# Patient Record
Sex: Female | Born: 1965 | Race: White | Hispanic: No | Smoking: Current every day smoker
Health system: Southern US, Community
[De-identification: ages and names within clinical notes are randomized; demographics above are authoritative.]

## PROBLEM LIST (undated history)

## (undated) DIAGNOSIS — J439 Emphysema, unspecified: Secondary | ICD-10-CM

## (undated) DIAGNOSIS — Z8619 Personal history of other infectious and parasitic diseases: Secondary | ICD-10-CM

## (undated) DIAGNOSIS — M549 Dorsalgia, unspecified: Secondary | ICD-10-CM

## (undated) DIAGNOSIS — G8929 Other chronic pain: Secondary | ICD-10-CM

## (undated) DIAGNOSIS — F329 Major depressive disorder, single episode, unspecified: Secondary | ICD-10-CM

## (undated) DIAGNOSIS — M5137 Other intervertebral disc degeneration, lumbosacral region: Secondary | ICD-10-CM

## (undated) DIAGNOSIS — G43909 Migraine, unspecified, not intractable, without status migrainosus: Secondary | ICD-10-CM

## (undated) DIAGNOSIS — T7840XA Allergy, unspecified, initial encounter: Secondary | ICD-10-CM

## (undated) DIAGNOSIS — R519 Headache, unspecified: Secondary | ICD-10-CM

## (undated) DIAGNOSIS — H8109 Meniere's disease, unspecified ear: Secondary | ICD-10-CM

## (undated) DIAGNOSIS — F32A Depression, unspecified: Secondary | ICD-10-CM

## (undated) DIAGNOSIS — R51 Headache: Secondary | ICD-10-CM

## (undated) DIAGNOSIS — M069 Rheumatoid arthritis, unspecified: Secondary | ICD-10-CM

## (undated) DIAGNOSIS — F1910 Other psychoactive substance abuse, uncomplicated: Secondary | ICD-10-CM

## (undated) HISTORY — DX: Headache: R51

## (undated) HISTORY — DX: Rheumatoid arthritis, unspecified: M06.9

## (undated) HISTORY — PX: KNEE ARTHROSCOPY W/ ACL RECONSTRUCTION: SHX1858

## (undated) HISTORY — DX: Migraine, unspecified, not intractable, without status migrainosus: G43.909

## (undated) HISTORY — DX: Other chronic pain: G89.29

## (undated) HISTORY — PX: TONSILLECTOMY: SUR1361

## (undated) HISTORY — DX: Emphysema, unspecified: J43.9

## (undated) HISTORY — PX: HAND SURGERY: SHX662

## (undated) HISTORY — PX: TYMPANOSTOMY TUBE PLACEMENT: SHX32

## (undated) HISTORY — DX: Allergy, unspecified, initial encounter: T78.40XA

## (undated) HISTORY — DX: Other intervertebral disc degeneration, lumbosacral region: M51.37

## (undated) HISTORY — DX: Personal history of other infectious and parasitic diseases: Z86.19

## (undated) HISTORY — DX: Dorsalgia, unspecified: M54.9

## (undated) HISTORY — PX: WRIST RECONSTRUCTION: SHX2675

## (undated) HISTORY — DX: Major depressive disorder, single episode, unspecified: F32.9

## (undated) HISTORY — DX: Other psychoactive substance abuse, uncomplicated: F19.10

## (undated) HISTORY — DX: Meniere's disease, unspecified ear: H81.09

## (undated) HISTORY — DX: Depression, unspecified: F32.A

## (undated) HISTORY — DX: Headache, unspecified: R51.9

---

## 2006-12-25 ENCOUNTER — Encounter: Admission: RE | Admit: 2006-12-25 | Discharge: 2006-12-25 | Payer: Self-pay | Admitting: Orthopedic Surgery

## 2010-12-25 DIAGNOSIS — H8109 Meniere's disease, unspecified ear: Secondary | ICD-10-CM

## 2010-12-25 DIAGNOSIS — M5137 Other intervertebral disc degeneration, lumbosacral region: Secondary | ICD-10-CM

## 2010-12-25 HISTORY — DX: Meniere's disease, unspecified ear: H81.09

## 2010-12-25 HISTORY — DX: Other intervertebral disc degeneration, lumbosacral region: M51.37

## 2012-06-15 DIAGNOSIS — F1910 Other psychoactive substance abuse, uncomplicated: Secondary | ICD-10-CM

## 2012-06-15 HISTORY — DX: Other psychoactive substance abuse, uncomplicated: F19.10

## 2012-06-28 DIAGNOSIS — M069 Rheumatoid arthritis, unspecified: Secondary | ICD-10-CM

## 2012-06-28 HISTORY — DX: Rheumatoid arthritis, unspecified: M06.9

## 2012-06-30 ENCOUNTER — Ambulatory Visit (INDEPENDENT_AMBULATORY_CARE_PROVIDER_SITE_OTHER): Payer: Self-pay | Admitting: Family Medicine

## 2012-06-30 ENCOUNTER — Encounter: Payer: Self-pay | Admitting: Family Medicine

## 2012-06-30 VITALS — BP 128/73 | HR 76 | Ht 65.0 in | Wt 140.0 lb

## 2012-06-30 DIAGNOSIS — M069 Rheumatoid arthritis, unspecified: Secondary | ICD-10-CM

## 2012-06-30 DIAGNOSIS — F1910 Other psychoactive substance abuse, uncomplicated: Secondary | ICD-10-CM

## 2012-06-30 DIAGNOSIS — M5137 Other intervertebral disc degeneration, lumbosacral region: Secondary | ICD-10-CM

## 2012-06-30 DIAGNOSIS — H8109 Meniere's disease, unspecified ear: Secondary | ICD-10-CM

## 2012-06-30 NOTE — Progress Notes (Signed)
CC: Julie [name] is a 47 y.o. female is here for Establish Care   Subjective: HPI:  Patient's past medical history was reviewed prior to visit using care everywhere showing recent visit to establish with a [facility] yesterday and visits in the past with my former practice [facility].  I have a concern regarding her controlled substance regimen do to a encounter note at [facility] 06/08/2012   when she was requesting oxycodone refill prior to appropriate time for refill and while under a narcotic contract with [facility] which I also worked under during my [facility] residency. Her past medical and behavioral history was discussed with a colleague of mine at [facility] to confirm that a narcotic contract was broken and that this patient is well-known at their practice for inappropriate early referral requests for Valium and oxycodone with multiple excuses over the past year.  It appears pain management referrals have been placed recently by [facility] and [facility] and [facility]  but patient has never established with a pain management clinic.  Patient's chief complaint today is an electric-like shooting pain down the right leg which is severe in nature and severe bilateral wrist pain. She describes this is been present for years and has been controlled with former oxycodone regimen.  She's been out of this regimen for a few weeks now and pain is now unbearable.  Nothing else seems to make better or worse. It is present on a daily basis.  I asked here her side of the story of her narcotic contract violation from [place].  She feels that this is one big misunderstanding as she reports calling the answering service during a snow day for refill and was instructed to followup in the resident clinic at [facility] by the physician on call. She reports when she did this she was then told that she violated her narcotic contract with  [facility]. I reviewed with her in person that she was not due for refill until the seventh of that month which is why some suspicion may have been aroused. She tells me is not uncommon for her to take extra oxycodone when she occasionally falls due to her Mnire's disease. She explicitly tells me that she doesn't understand why it such a big deal if she has to take a couple extra oxycodone without the permission of her prescribing physician.   Review Of Systems of Outlined In HPI  Past Medical History  Diagnosis Date  . Meniere disease   . Mixed, or nondependent drug abuse 06/15/2012    Overview:  Dismissed from [facility] 06/15/12 for seeking meds early at [facility] with pain contract in place Do not give controlled substances to this patient   . Degeneration of lumbar or lumbosacral intervertebral disc 12/25/2010  . Meniere's disease 12/25/2010  . Rheumatoid arthritis 06/28/2012     Family History  Problem Relation Age of Onset  . Cancer      parent  . Heart attack      parent  . Diabetes      grandparent,aunt  . Hypertension      parent  . Stroke      grandparent     History  Substance Use Topics  . Smoking status: Current Every Day Smoker -- 1.50 packs/day for 20 years    Types: Cigarettes  . Smokeless tobacco: Not on file  . Alcohol Use: No     Objective: Filed Vitals:   06/30/12  1509  BP: 128/73  Pulse: 76    Vital signs reviewed. General: Alert and Oriented, No Acute Distress HEENT: Pupils equal, round, reactive to light. Conjunctivae clear.  External ears unremarkable.  Moist mucous membranes. Lungs: Clear and comfortable work of breathing, speaking in full sentences without accessory muscle use. Cardiac: Regular rate and rhythm.  Neuro: CN II-XII grossly intact, gait normal. Extremities: No peripheral edema.  Strong peripheral pulses.  Mental Status: No depression, anxiety, nor agitation. Logical though process. Skin: Warm and dry.  Assessment & Plan: Darin  was seen today for establish care.  Diagnoses and associated orders for this visit:  Mixed, or nondependent drug abuse  Degeneration of lumbar or lumbosacral intervertebral disc  Meniere's disease, unspecified laterality  Rheumatoid arthritis  Discussed with the patient that I do not feel our philosophies on chronic narcotic use overlap much especially when taken other than prescribed.  Discussed with her that I did not feel comfortable prescribing controlled substances based on what I can gather from her past medical history. Similar to other physicians I have offered her pain management referral or rheumatology referral. She declines both. We were never multiple regimens of nonnarcotic pain medication all of which she declined.  She left dissatisfied with the above news before a more thorough physical exam could be performed. She was not interested in routine preventative services such as lab work and cancer risk.  Return if symptoms worsen or fail to improve.

## 2012-07-09 ENCOUNTER — Encounter: Payer: Self-pay | Admitting: Family Medicine

## 2012-07-09 DIAGNOSIS — Z8619 Personal history of other infectious and parasitic diseases: Secondary | ICD-10-CM

## 2012-07-09 DIAGNOSIS — E0590 Thyrotoxicosis, unspecified without thyrotoxic crisis or storm: Secondary | ICD-10-CM | POA: Insufficient documentation

## 2014-08-31 ENCOUNTER — Ambulatory Visit (INDEPENDENT_AMBULATORY_CARE_PROVIDER_SITE_OTHER): Payer: Medicare Other | Admitting: Nurse Practitioner

## 2014-08-31 ENCOUNTER — Encounter: Payer: Self-pay | Admitting: Nurse Practitioner

## 2014-08-31 ENCOUNTER — Telehealth: Payer: Self-pay | Admitting: *Deleted

## 2014-08-31 VITALS — BP 130/90 | HR 79 | Temp 98.0°F | Resp 18 | Ht 64.5 in | Wt 144.0 lb

## 2014-08-31 DIAGNOSIS — G8929 Other chronic pain: Secondary | ICD-10-CM

## 2014-08-31 DIAGNOSIS — M2550 Pain in unspecified joint: Secondary | ICD-10-CM | POA: Diagnosis not present

## 2014-08-31 DIAGNOSIS — M549 Dorsalgia, unspecified: Secondary | ICD-10-CM

## 2014-08-31 DIAGNOSIS — F329 Major depressive disorder, single episode, unspecified: Secondary | ICD-10-CM

## 2014-08-31 DIAGNOSIS — E039 Hypothyroidism, unspecified: Secondary | ICD-10-CM | POA: Diagnosis not present

## 2014-08-31 DIAGNOSIS — R4589 Other symptoms and signs involving emotional state: Secondary | ICD-10-CM

## 2014-08-31 LAB — CBC WITH DIFFERENTIAL/PLATELET
BASOS ABS: 0.1 10*3/uL (ref 0.0–0.1)
BASOS PCT: 0.6 % (ref 0.0–3.0)
EOS PCT: 2.5 % (ref 0.0–5.0)
Eosinophils Absolute: 0.2 10*3/uL (ref 0.0–0.7)
HEMATOCRIT: 57.7 % — AB (ref 36.0–46.0)
HEMOGLOBIN: 19 g/dL — AB (ref 12.0–15.0)
LYMPHS PCT: 23.1 % (ref 12.0–46.0)
Lymphs Abs: 2.3 10*3/uL (ref 0.7–4.0)
MCHC: 32.9 g/dL (ref 30.0–36.0)
MCV: 91.1 fl (ref 78.0–100.0)
MONOS PCT: 3.3 % (ref 3.0–12.0)
Monocytes Absolute: 0.3 10*3/uL (ref 0.1–1.0)
NEUTROS ABS: 6.9 10*3/uL (ref 1.4–7.7)
Neutrophils Relative %: 70.5 % (ref 43.0–77.0)
Platelets: 167 10*3/uL (ref 150.0–400.0)
RBC: 6.33 Mil/uL — AB (ref 3.87–5.11)
RDW: 14.7 % (ref 11.5–15.5)
WBC: 9.8 10*3/uL (ref 4.0–10.5)

## 2014-08-31 LAB — COMPREHENSIVE METABOLIC PANEL
ALBUMIN: 4.4 g/dL (ref 3.5–5.2)
ALT: 15 U/L (ref 0–35)
AST: 15 U/L (ref 0–37)
Alkaline Phosphatase: 75 U/L (ref 39–117)
BUN: 7 mg/dL (ref 6–23)
CHLORIDE: 101 meq/L (ref 96–112)
CO2: 30 mEq/L (ref 19–32)
Calcium: 10.2 mg/dL (ref 8.4–10.5)
Creatinine, Ser: 0.84 mg/dL (ref 0.40–1.20)
GFR: 77.34 mL/min (ref >60.00)
Glucose, Bld: 79 mg/dL (ref 70–99)
POTASSIUM: 3.9 meq/L (ref 3.5–5.1)
SODIUM: 140 meq/L (ref 135–145)
TOTAL PROTEIN: 7.2 g/dL (ref 6.0–8.3)
Total Bilirubin: 0.6 mg/dL (ref 0.2–1.2)

## 2014-08-31 LAB — URINALYSIS, MICROSCOPIC ONLY

## 2014-08-31 LAB — T4, FREE: Free T4: 0.63 ng/dL (ref 0.60–1.60)

## 2014-08-31 LAB — RHEUMATOID FACTOR

## 2014-08-31 LAB — SEDIMENTATION RATE: Sed Rate: 2 mm/hr (ref 0–22)

## 2014-08-31 LAB — TSH: TSH: 1.29 u[IU]/mL (ref 0.35–4.50)

## 2014-08-31 MED ORDER — OXYCODONE-ACETAMINOPHEN 10-325 MG PO TABS: 1.0000 | ORAL_TABLET | ORAL | Status: DC | PRN

## 2014-08-31 MED ORDER — PREDNISONE 10 MG PO TABS
ORAL_TABLET | ORAL | Status: DC
Start: 2014-08-31 — End: 2014-09-18

## 2014-08-31 MED ORDER — SERTRALINE HCL 25 MG PO TABS: ORAL_TABLET | ORAL | Status: DC

## 2014-08-31 NOTE — Telephone Encounter (Signed)
Noted. She is heavy smoker & probably a bit dehydrated.

## 2014-08-31 NOTE — Progress Notes (Signed)
Subjective:     Julie [name] is a 49 y.o. female presents to establish care. She says she needs her "meds". She has been taking oxycodone 20 mg T for many mos & 30 mg tablets before that for years. She ran out 6/19. She is feeling dizzy, having diarrhea, cannot sleep, back hurts & radiates into R buttock & leg to knee. Last provider she saw was Dr Hilma Favors in [facility] (07/27/14 note reviewed in care everywhere). Pt says he discharged her for "not agreeing with treatment plan". She says this came as surprise to her-she received letter. She understood he would refer her to pain management & psychiatry. She did not recv call from pain mngt & psychiatry location was not accepting new pts.  She has long Hx of depression-states "one death after another has occurred in her family". She has been taking prozac & well butrin for 5 yrs-doesn't think they work anymore. Doesn't take them daily. Back pain has been chronic for at least 10 yrs. She has been taking narcotic pain meds at least that long. Last pain management appt was May/2015 [facility]. She says she was discharged from that facility due to tested pos for hydrocodone, but prescribed oxycodone. She denies having taken anything but what was prescribed. She has been to PT, had "steroid injections" in back w/minimal relief. She has been taking valium since ran out of percocet. Minimal relief. She complains of multiple joint pain: hands, elbows, shoulders, hips. Had referral to rheumatology once, but was not able to keep appt. Denies swollen red hot joints, fever, sores in mouth, rash. Feels fatigued most of the time.  She is on disability. She is a smoker. "quit drinking" ETOH 2 yrs ago. Admits to 6 ppd beer for "years". She appears very uncomfortable-sighs a lot, moans, shifts positions, moves gingerly, talks frankly. Reviewed last yr [place] Controlled Substance Report : congruent w/patient report. Taking 1T oxycodone 20 mg qid for all 12  mos.  The following portions of the patient's history were reviewed and updated as appropriate: allergies, current medications, past family history, past medical history, past social history, past surgical history and problem list.  Review of Systems Eyes: positive for redness and L eye, denies injury Ears, nose, mouth, throat, and face: positive for hearing loss and has hearing aid-not wearing today Respiratory: positive for SOB w/little exertion-says prednisone makes her breath better Cardiovascular: negative for chest pain, chest pressure/discomfort, lower extremity edema and palpitations Gastrointestinal: positive for diarrhea Neurological: negative for paresthesia and weakness Behavioral/Psych: positive for depression, sleep disturbance and tobacco use    Objective:    BP 130/90 mmHg  Pulse 79  Temp(Src) 98 F (36.7 C) (Oral)  Resp 18  Ht 5' 4.5" (1.638 m)  Wt 144 lb (65.318 kg)  BMI 24.34 kg/m2  SpO2 95% BP 130/90 mmHg  Pulse 79  Temp(Src) 98 F (36.7 C) (Oral)  Resp 18  Ht 5' 4.5" (1.638 m)  Wt 144 lb (65.318 kg)  BMI 24.34 kg/m2  SpO2 95% General appearance: alert, cooperative, appears older than stated age, fatigued and moderate distress Head: Normocephalic, without obvious abnormality, atraumatic Eyes: negative findings: lids and lashes normal, positive findings: conjunctiva: trace injection, red raised area lateral L eye-pingueculum? Back: no tenderness to percussion or palpation, range of motion normal, traps tense, spinal accessory muscles tense. tender at bilat SIJ, no spinal tenderness. L shoulder higher than right. mild kyphosis Lungs: course breath sounds bilat Heart: regular rate and rhythm, S1, S2 normal, no  murmur, click, rub or gallop Neurologic: Gait: Antalgic MSK: pos squeeze test R foot & both hands. bilat 1st MCP enlarged & tender      Assessment:Plan   1. Polyarthralgia DD: RA, SLE, psoriatic arthritis, OA - CBC with Differential/Platelet -  Antinuclear Antibodies, IFA - Comprehensive metabolic panel - Cyclic citrul peptide antibody, IgG - TSH - T4, free - Sedimentation rate - Rheumatoid factor - Urine Microscopic - oxyCODONE-acetaminophen (PERCOCET) 10-325 MG per tablet; Take 1 tablet by mouth every 4 (four) hours as needed for pain.  Dispense: 30 tablet; Refill: 0 - predniSONE (DELTASONE) 10 MG tablet; Take 3Tpo qam X 3d, then 2T po qam X 4d, then 1Tpo qd X 4d  Dispense: 21 tablet; Refill: 0 - Ambulatory referral to Pain Clinic  2. Depressed affect start - sertraline (ZOLOFT) 25 MG tablet; Take 1 t po qhs X 1 wk, then 2T po qhs  Dispense: 30 tablet; Refill: 0 Stop prozac Cont wellbutrin - Ambulatory referral to Psychiatry  3. Chronic back pain DD: DDD, sacroilliitis  - Ambulatory referral to Pain Clinic  F/u 2 weeks-pain, labs

## 2014-08-31 NOTE — Progress Notes (Signed)
Pre visit review using our clinic review tool, if applicable. No additional management support is needed unless otherwise documented below in the visit note. 

## 2014-08-31 NOTE — Patient Instructions (Signed)
Please start prednisone. Take in morning as it will keep you awake. Start sertraline. Take at night. Stop prozac. Continue wellbutrin. Take oxycodone & valium as needed. Please see psychiatry & pain clinic.  Please return in 10 days.  Nice to meet you.

## 2014-08-31 NOTE — Telephone Encounter (Signed)
Clydie Braun from [place] lab called with critical lab results for pt. Hgb of 19. Please advise. Thanks.

## 2014-09-01 LAB — ANTINUCLEAR ANTIBODIES, IFA: ANA Titer 1: NEGATIVE

## 2014-09-01 LAB — CYCLIC CITRUL PEPTIDE ANTIBODY, IGG: Cyclic Citrullin Peptide Ab: <2 U/mL (ref 0.0–5.0)

## 2014-09-04 ENCOUNTER — Telehealth: Payer: Self-pay | Admitting: Nurse Practitioner

## 2014-09-04 NOTE — Telephone Encounter (Signed)
LMOM for pt to CB.  VM was set up by a man, not pt.

## 2014-09-04 NOTE — Telephone Encounter (Signed)
pls call pt: Advise Labs normal except hemoglobin is high-she sould return for additional labs to screen for hemochromatosis-too much iron. pls shedule f/u appt.

## 2014-09-05 NOTE — Telephone Encounter (Signed)
LMOM for pt to CB.  

## 2014-09-06 NOTE — Telephone Encounter (Signed)
Pt aware.  Appointment scheduled 09/13/14.

## 2014-09-07 ENCOUNTER — Ambulatory Visit: Payer: Medicare Other | Admitting: Nurse Practitioner

## 2014-09-13 ENCOUNTER — Ambulatory Visit: Payer: Medicare Other | Admitting: Nurse Practitioner

## 2014-09-18 ENCOUNTER — Ambulatory Visit (INDEPENDENT_AMBULATORY_CARE_PROVIDER_SITE_OTHER): Payer: Medicare Other | Admitting: Nurse Practitioner

## 2014-09-18 ENCOUNTER — Encounter: Payer: Self-pay | Admitting: Nurse Practitioner

## 2014-09-18 VITALS — BP 127/85 | HR 82 | Temp 97.8°F | Resp 16 | Ht 65.0 in | Wt 147.4 lb

## 2014-09-18 DIAGNOSIS — D582 Other hemoglobinopathies: Secondary | ICD-10-CM

## 2014-09-18 DIAGNOSIS — F329 Major depressive disorder, single episode, unspecified: Secondary | ICD-10-CM | POA: Diagnosis not present

## 2014-09-18 DIAGNOSIS — R4589 Other symptoms and signs involving emotional state: Secondary | ICD-10-CM

## 2014-09-18 DIAGNOSIS — M2550 Pain in unspecified joint: Secondary | ICD-10-CM

## 2014-09-18 LAB — IRON AND TIBC
%SAT: 16 % — ABNORMAL LOW (ref 20–55)
Iron: 48 ug/dL (ref 42–145)
TIBC: 294 ug/dL (ref 250–470)
UIBC: 246 ug/dL (ref 125–400)

## 2014-09-18 LAB — TRANSFERRIN: TRANSFERRIN: 248 mg/dL (ref 212.0–360.0)

## 2014-09-18 MED ORDER — OXYCODONE-ACETAMINOPHEN 10-325 MG PO TABS: 1.0000 | ORAL_TABLET | Freq: Two times a day (BID) | ORAL | Status: DC | PRN

## 2014-09-18 MED ORDER — SERTRALINE HCL 50 MG PO TABS: 50.0000 mg | ORAL_TABLET | Freq: Every day | ORAL | Status: AC

## 2014-09-18 NOTE — Patient Instructions (Signed)
My office will call with lab results.  Consider using Neiled sinus wash daily for best allergy management & sinus health.   Please keep appointment with pain management and make appointment with psychiatry.

## 2014-09-19 ENCOUNTER — Encounter: Payer: Self-pay | Admitting: Nurse Practitioner

## 2014-09-19 ENCOUNTER — Telehealth: Payer: Self-pay | Admitting: Nurse Practitioner

## 2014-09-19 DIAGNOSIS — D582 Other hemoglobinopathies: Secondary | ICD-10-CM | POA: Insufficient documentation

## 2014-09-19 DIAGNOSIS — R4589 Other symptoms and signs involving emotional state: Secondary | ICD-10-CM | POA: Insufficient documentation

## 2014-09-19 DIAGNOSIS — M2550 Pain in unspecified joint: Secondary | ICD-10-CM | POA: Insufficient documentation

## 2014-09-19 NOTE — Progress Notes (Signed)
Subjective:     Julie [name] is a 49 y.o. female presents for f/u of elevated hemoglobin. She is a smoker, but has been seeking treatment for chronic MSK pain for years. She denies palpitations, fatigue, abdominal pain. I will assess iron panel & transferritin to screen for hemochromatosis.  She has appointment with pain management clinic in 4 weeks. Her rheum w/u was negative-at least no serum markers. I did not order xrays of hands & feet. She tells me the prednisone I presribed did not help her back or joint pain. She did, however, run use all 30 T of oxycodone within 5 days of last OV. Since then, she has been taking NSAIDS without much relief.  Last OV I stopped paxil & started zoloft. Ms Loudenslager reports she has had some elevation of mood. No intol SE. She continues to take wellbutrin. I ref her to psychiatry. She does not have appt yet.  The following portions of the patient's history were reviewed and updated as appropriate: allergies, current medications, past medical history, past social history, past surgical history and problem list.  Review of Systems Pertinent items are noted in HPI.    Objective:    BP 127/85 mmHg  Pulse 82  Temp(Src) 97.8 F (36.6 C) (Oral)  Resp 16  Ht 5\' 5"  (1.651 m)  Wt 147 lb 6.4 oz (66.86 kg)  BMI 24.53 kg/m2  SpO2 95% General appearance: alert, cooperative, appears older than stated age, mild distress and constantly rubbing R thigh, moans & sighs as ambulates around office Eyes: positive findings: conjunctiva: 2+ injection Neurologic: Gait: Antalgic    Assessment:Plan   1. Elevated hemoglobin Likely secondary to smoker, but will screen for hemochromatosis Stop smoking - Transferrin - Iron and TIBC  2. Polyarthralgia No serum markers for RA. xrays not performed.  Pt has appt w/pain management in 4 weeks. - oxyCODONE-acetaminophen (PERCOCET) 10-325 MG per tablet; Take 1 tablet by mouth 2 (two) times daily as needed for pain.  Dispense: 60  tablet; Refill: 0  3. Depressed affect continue - sertraline (ZOLOFT) 50 MG tablet; Take 1 tablet (50 mg total) by mouth daily.  Dispense: 30 tablet; Refill: 2 See psychiatry  F/u PRN labs

## 2014-09-19 NOTE — Telephone Encounter (Signed)
pls call pt: Julie [name] iron is not causing [name] to have too many red cells-it is due to smoking. Having too many red cells increases [name] risk for stroke, she should try to cut back or quit cigarettes.

## 2014-09-20 NOTE — Telephone Encounter (Signed)
LMOM for pt to CB.  

## 2014-09-25 NOTE — Telephone Encounter (Signed)
Patient aware.    Patient states that taking two of the percocet is not killing her pain.  Pt wants to know what to do next?  Please advise.

## 2014-09-26 NOTE — Telephone Encounter (Signed)
She needs to see pain management

## 2014-09-26 NOTE — Telephone Encounter (Signed)
Pts phone "can not accept calls at this time".   Will have to try again later.   Pt has appointment scheduled with pain management in August.

## 2014-09-28 NOTE — Telephone Encounter (Signed)
Patient aware she must wait to see pain management.

## 2014-09-28 NOTE — Telephone Encounter (Signed)
Patient called back. Please call her at [phone].

## 2014-11-06 ENCOUNTER — Telehealth: Payer: Self-pay

## 2014-11-06 NOTE — Telephone Encounter (Signed)
Pt has been rxed the following:   Percocet 10/325 1 tablet BID  Nucynta ER 100 mg 1 tablet BID

## 2017-04-26 ENCOUNTER — Emergency Department (HOSPITAL_BASED_OUTPATIENT_CLINIC_OR_DEPARTMENT_OTHER)
Admission: EM | Admit: 2017-04-26 | Discharge: 2017-04-26 | Disposition: A | Discharge status: Home / Self Care | Payer: Medicare Other | Attending: Emergency Medicine | Admitting: Emergency Medicine

## 2017-04-26 ENCOUNTER — Encounter (HOSPITAL_BASED_OUTPATIENT_CLINIC_OR_DEPARTMENT_OTHER): Payer: Self-pay | Admitting: *Deleted

## 2017-04-26 ENCOUNTER — Emergency Department (HOSPITAL_BASED_OUTPATIENT_CLINIC_OR_DEPARTMENT_OTHER): Payer: Medicare Other

## 2017-04-26 ENCOUNTER — Other Ambulatory Visit: Payer: Self-pay

## 2017-04-26 DIAGNOSIS — M069 Rheumatoid arthritis, unspecified: Secondary | ICD-10-CM | POA: Diagnosis not present

## 2017-04-26 DIAGNOSIS — Z79899 Other long term (current) drug therapy: Secondary | ICD-10-CM | POA: Insufficient documentation

## 2017-04-26 DIAGNOSIS — L0201 Cutaneous abscess of face: Secondary | ICD-10-CM | POA: Diagnosis not present

## 2017-04-26 DIAGNOSIS — J449 Chronic obstructive pulmonary disease, unspecified: Secondary | ICD-10-CM | POA: Insufficient documentation

## 2017-04-26 DIAGNOSIS — F17210 Nicotine dependence, cigarettes, uncomplicated: Secondary | ICD-10-CM | POA: Insufficient documentation

## 2017-04-26 LAB — COMPREHENSIVE METABOLIC PANEL
ALT: 19 U/L (ref 14–54)
AST: 15 U/L (ref 15–41)
Albumin: 3.8 g/dL (ref 3.5–5.0)
Alkaline Phosphatase: 67 U/L (ref 38–126)
Anion gap: 8 (ref 5–15)
BUN: 16 mg/dL (ref 6–20)
CHLORIDE: 103 mmol/L (ref 101–111)
CO2: 30 mmol/L (ref 22–32)
CREATININE: 0.7 mg/dL (ref 0.44–1.00)
Calcium: 9 mg/dL (ref 8.9–10.3)
Glucose, Bld: 99 mg/dL (ref 65–99)
POTASSIUM: 3.7 mmol/L (ref 3.5–5.1)
Sodium: 141 mmol/L (ref 135–145)
Total Bilirubin: 0.5 mg/dL (ref 0.3–1.2)
Total Protein: 6.5 g/dL (ref 6.5–8.1)

## 2017-04-26 LAB — CBC WITH DIFFERENTIAL/PLATELET
Basophils Absolute: 0 10*3/uL (ref 0.0–0.1)
Basophils Relative: 0 %
EOS ABS: 0.5 10*3/uL (ref 0.0–0.7)
Eosinophils Relative: 3 %
HCT: 41.5 % (ref 36.0–46.0)
Hemoglobin: 13.8 g/dL (ref 12.0–15.0)
LYMPHS ABS: 1.6 10*3/uL (ref 0.7–4.0)
Lymphocytes Relative: 9 %
MCH: 29.6 pg (ref 26.0–34.0)
MCHC: 33.3 g/dL (ref 30.0–36.0)
MCV: 88.9 fL (ref 78.0–100.0)
Monocytes Absolute: 1.1 10*3/uL — ABNORMAL HIGH (ref 0.1–1.0)
Monocytes Relative: 6 %
NEUTROS PCT: 82 %
Neutro Abs: 14.4 10*3/uL — ABNORMAL HIGH (ref 1.7–7.7)
Platelets: 182 10*3/uL (ref 150–400)
RBC: 4.67 MIL/uL (ref 3.87–5.11)
RDW: 14 % (ref 11.5–15.5)
WBC: 17.7 10*3/uL — ABNORMAL HIGH (ref 4.0–10.5)

## 2017-04-26 LAB — HCG, QUANTITATIVE, PREGNANCY: hCG, Beta Chain, Quant, S: 1 m[IU]/mL (ref <5)

## 2017-04-26 MED ORDER — CEPHALEXIN 500 MG PO CAPS: 500.0000 mg | ORAL_CAPSULE | Freq: Two times a day (BID) | ORAL | 0 refills | Status: AC

## 2017-04-26 MED ORDER — SULFAMETHOXAZOLE-TRIMETHOPRIM 800-160 MG PO TABS
1.0000 | ORAL_TABLET | Freq: Once | ORAL | Status: AC
  Administered 2017-04-26: 1 via ORAL
  Filled 2017-04-26: qty 1

## 2017-04-26 MED ORDER — CEPHALEXIN 250 MG PO CAPS
500.0000 mg | ORAL_CAPSULE | Freq: Once | ORAL | Status: AC
  Administered 2017-04-26: 500 mg via ORAL
  Filled 2017-04-26: qty 2

## 2017-04-26 MED ORDER — LIDOCAINE-EPINEPHRINE 2 %-1:100000 IJ SOLN
20.0000 mL | Freq: Once | INTRAMUSCULAR | Status: DC
  Filled 2017-04-26: qty 1

## 2017-04-26 MED ORDER — MORPHINE SULFATE (PF) 4 MG/ML IV SOLN
4.0000 mg | Freq: Once | INTRAVENOUS | Status: AC
  Administered 2017-04-26: 4 mg via INTRAVENOUS
  Filled 2017-04-26: qty 1

## 2017-04-26 MED ORDER — IOPAMIDOL (ISOVUE-300) INJECTION 61%
100.0000 mL | Freq: Once | INTRAVENOUS | Status: AC | PRN
  Administered 2017-04-26: 75 mL via INTRAVENOUS

## 2017-04-26 MED ORDER — ONDANSETRON HCL 4 MG/2ML IJ SOLN
4.0000 mg | Freq: Once | INTRAMUSCULAR | Status: AC
  Administered 2017-04-26: 4 mg via INTRAVENOUS
  Filled 2017-04-26: qty 2

## 2017-04-26 MED ORDER — SULFAMETHOXAZOLE-TRIMETHOPRIM 800-160 MG PO TABS: 1.0000 | ORAL_TABLET | Freq: Two times a day (BID) | ORAL | 0 refills | Status: AC

## 2017-04-26 MED ORDER — OXYCODONE-ACETAMINOPHEN 5-325 MG PO TABS
1.0000 | ORAL_TABLET | Freq: Once | ORAL | Status: AC
  Administered 2017-04-26: 1 via ORAL
  Filled 2017-04-26: qty 1

## 2017-04-26 MED ORDER — CLINDAMYCIN PHOSPHATE 600 MG/50ML IV SOLN: 600.0000 mg | Freq: Once | INTRAVENOUS | Status: DC

## 2017-04-26 MED ORDER — OXYCODONE-ACETAMINOPHEN 5-325 MG PO TABS: 1.0000 | ORAL_TABLET | Freq: Four times a day (QID) | ORAL | 0 refills | Status: AC | PRN

## 2017-04-26 NOTE — ED Notes (Signed)
ED Provider at bedside with US 

## 2017-04-26 NOTE — ED Notes (Signed)
Pt reminded of the need for a urine sample. Secretary advised pt ambulated to restroom a couple hours ago, but was not given a specimen cup.  Pt unable to void at this time.

## 2017-04-26 NOTE — ED Triage Notes (Signed)
Alert, NAD, calm, interactive, resps e/u, speaking in clear complete sentences, no dyspnea noted, skin W&D, VSS, c/o mid lower chin abscess with heat, swelling and TTP, actively draining (scant, serous), swelling up to lower lip, (denies: sob, NVD, fever, dizziness or visual changes), no meds PTA, no relief with advil or APAP yesterday. Tdap unknown. No flu vaccine this season. Family at [facility].

## 2017-04-26 NOTE — ED Notes (Signed)
Assumed care of patient from [place], [place]. Pt resting quietly. No distress. Airway patent. Awaiting EDP disposition.

## 2017-04-26 NOTE — ED Provider Notes (Signed)
[facility] EMERGENCY DEPARTMENT Provider Note   CSN: [id number] Arrival date & time: 04/26/17  0606     History   Chief Complaint Chief Complaint  Patient presents with  . Abscess    HPI Julie [name] is a 52 y.o. female.  52 year old female with past medical history including COPD, rheumatoid arthritis, Mnire's disease, migraines who presents with a chin infection.  Yesterday she began having pain and swelling of her chin around an area that initially felt like a pimple.  The swelling has spread to her lower lip and around her right jaw as well as below her chin.  She has had associated warmth, redness, and now severe and constant pain.  Pain not improved with Tylenol and advil at home yesterday.  She denies any problems swallowing, breathing difficulty, fevers, nausea, vomiting, diarrhea, or recent illness.  She denies any dental pain.   The history is provided by the patient.  Abscess    Past Medical History:  Diagnosis Date  . Allergy   . Chronic back pain   . Degeneration of lumbar or lumbosacral intervertebral disc 12/25/2010  . Depression   . Emphysema of lung (HCC)   . Frequent headaches   . History of chicken pox   . History of shingles   . Meniere disease   . Meniere's disease 12/25/2010  . Migraines   . Mixed, or nondependent drug abuse 06/15/2012   Overview:  Dismissed from [facility] 06/15/12 for seeking meds early at [facility] with pain contract in place Do not give controlled substances to this patient   . Rheumatoid arthritis(714.0) 06/28/2012    Patient Active Problem List   Diagnosis Date Noted  . Depressed affect 09/19/2014  . Polyarthralgia 09/19/2014  . Elevated hemoglobin (HCC) 09/19/2014  . History of shingles 07/09/2012  . Hyperthyroidism 07/09/2012  . Rheumatoid arthritis (HCC) 06/28/2012  . Mixed, or nondependent drug abuse 06/15/2012  . Degeneration of lumbar or lumbosacral intervertebral disc 12/25/2010  . Meniere's disease  12/25/2010    Past Surgical History:  Procedure Laterality Date  . CESAREAN SECTION  1992,2000  . HAND SURGERY  x 2  . KNEE ARTHROSCOPY W/ ACL RECONSTRUCTION    . TONSILLECTOMY    . TYMPANOSTOMY TUBE PLACEMENT    . WRIST RECONSTRUCTION      OB History    No data available       Home Medications    Prior to Admission medications   Medication Sig Start Date End Date Taking? Authorizing Provider  beclomethasone (QVAR) 40 MCG/ACT inhaler Inhale 2 puffs into the lungs. 09/26/13   [provider]  BuPROPion HCl (WELLBUTRIN PO) Take by mouth.    [provider]  cephALEXin (KEFLEX) 500 MG capsule Take 1 capsule (500 mg total) by mouth 2 (two) times daily. 04/26/17   Little, Ambrose Finland, MD  diazepam (VALIUM) 5 MG tablet  08/29/14   [provider]  dicyclomine (BENTYL) 20 MG tablet Take 20 mg by mouth 2 (two) times daily.    [provider]  Fluocinolone Acetonide 0.01 % OIL  06/22/14   [provider]  fluticasone Aleda Grana) 50 MCG/ACT nasal spray  06/20/14   [provider]  omeprazole (PRILOSEC) 40 MG capsule Take 40 mg by mouth daily.    [provider]  oxyCODONE-acetaminophen (PERCOCET) 5-325 MG tablet Take 1-2 tablets by mouth every 6 (six) hours as needed. 04/26/17   Little, Ambrose Finland, MD  PROAIR HFA 108 [phone] BASE) MCG/ACT inhaler  06/23/14   [provider]  rizatriptan (MAXALT) 10 MG tablet Take 10 mg by mouth as needed for migraine. May repeat in 2 hours if needed    [provider]  sertraline (ZOLOFT) 50 MG tablet Take 1 tablet (50 mg total) by mouth daily. 09/18/14   Kelle Darting, NP  sulfamethoxazole-trimethoprim (BACTRIM DS,SEPTRA DS) 800-160 MG tablet Take 1 tablet by mouth 2 (two) times daily for 7 days. 04/26/17 05/03/17  Little, Ambrose Finland, MD  triamterene-hydrochlorothiazide (DYAZIDE) 37.5-25 MG per capsule Take 1 capsule by mouth every morning.    [provider]    Family  History Family History  Problem Relation Age of Onset  . Goiter Mother   . Brain cancer Mother   . Hypertension Mother   . Heart attack Father   . Arthritis Father   . Meniere's disease Father   . Kidney cancer Sister   . Cancer Sister        renal  . Diabetes Maternal Aunt   . Stroke Maternal Grandmother   . Diabetes Maternal Grandmother     Social History Social History   Tobacco Use  . Smoking status: Current Every Day Smoker    Packs/day: 1.50    Years: 32.00    Pack years: 48.00    Types: Cigarettes  . Smokeless tobacco: Never Used  Substance Use Topics  . Alcohol use: No    Comment: pt reports quit 2 yrs ago-6 pk beer daily  . Drug use: No     Allergies   Penicillins   Review of Systems Review of Systems All other systems reviewed and are negative except that which was mentioned in HPI   Physical Exam Updated Vital Signs BP (!) 111/96 (BP Location: Right Arm)   Pulse 82   Temp 97.6 F (36.4 C) (Oral)   Resp 20   SpO2 96%   Physical Exam  Constitutional: She is oriented to person, place, and time. She appears well-developed and well-nourished. No distress.  uncomfortable  HENT:  Head: Normocephalic and atraumatic.  Mouth/Throat: Oropharynx is clear and moist.  Moist mucous membranes Swelling of chin spreading to upper neck, L mandible w/ associated erythema and tenderness, no obvious dental abscess although poor dentition, small draining pustule on central chin, edema of R lower lip, no tongue swelling  Eyes: Conjunctivae are normal.  Neck: Neck supple.  Cardiovascular: Normal rate, regular rhythm and normal heart sounds.  No murmur heard. Pulmonary/Chest: Effort normal and breath sounds normal.  Abdominal: Soft. Bowel sounds are normal. She exhibits no distension. There is no tenderness.  Musculoskeletal: She exhibits no edema.  Neurological: She is alert and oriented to person, place, and time.  Fluent speech  Skin: Skin is warm and dry.  There is erythema.  Psychiatric: She has a normal mood and affect. Judgment normal.  Nursing note and vitals reviewed.      ED Treatments / Results  Labs (all labs ordered are listed, but only abnormal results are displayed) Labs Reviewed  CBC WITH DIFFERENTIAL/PLATELET - Abnormal; Notable for the following components:      Result Value   WBC 17.7 (*)    Neutro Abs 14.4 (*)    Monocytes Absolute 1.1 (*)    All other components within normal limits  COMPREHENSIVE METABOLIC PANEL  HCG, QUANTITATIVE, PREGNANCY    EKG  EKG Interpretation None       Radiology Ct Soft Tissue Neck W Contrast  Result Date: 04/26/2017 CLINICAL DATA:  Neck  pain.  Suspect infection.  Swelling of chin EXAM: CT NECK WITH CONTRAST TECHNIQUE: Multidetector CT imaging of the neck was performed using the standard protocol following the bolus administration of intravenous contrast. CONTRAST:  60mL ISOVUE-300 IOPAMIDOL (ISOVUE-300) INJECTION 61% COMPARISON:  None. FINDINGS: Pharynx and larynx: Normal. No mass or swelling. Salivary glands: No inflammation, mass, or stone. Thyroid: Multiple subcentimeter left thyroid nodules. Right thyroid lobe normal. Lymph nodes: No pathologic adenopathy. Subcentimeter lymph nodes in the neck bilaterally appear reactive. Vascular: Negative Limited intracranial: Negative Visualized orbits: Negative Mastoids and visualized paranasal sinuses: Negative Skeleton: Poor dentition with numerous caries. No periapical dental infection. Cervical kyphosis with multilevel degenerative change. Upper chest: Emphysema without mass or infiltrate Other: Soft tissue swelling anterior to the mandible in the midline. Subcutaneous rim enhancing fluid collection measuring 8 x 12 mm compatible with dermal abscess with surrounding cellulitis. This does not appear to be of dental origin. IMPRESSION: 8 x 12 mm dermal abscess anterior to the mandible in the midline. Poor dentition with numerous caries Reactive  lymph nodes in the neck Multiple subcentimeter left thyroid nodules. Electronically Signed   By: Marlan Palau M.D.   On: 04/26/2017 11:46    Procedures .Marland KitchenIncision and Drainage Date/Time: 04/26/2017 3:25 PM Performed by: Laurence Spates, MD Authorized by: Laurence Spates, MD   Consent:    Consent obtained:  Verbal   Consent given by:  Patient   Risks discussed:  Bleeding, damage to other organs and pain (scarring) Location:    Type:  Abscess   Size:  8 x 12 mm   Location:  Head   Head location:  Face Pre-procedure details:    Skin preparation:  Betadine Anesthesia (see MAR for exact dosages):    Anesthesia method:  Local infiltration   Local anesthetic:  Lidocaine 2% WITH epi Procedure type:    Complexity:  Simple Procedure details:    Incision types:  Stab incision   Incision depth:  Dermal   Scalpel blade:  11   Drainage:  Bloody   Drainage amount:  Scant   Wound treatment:  Wound left open   Packing materials:  None Post-procedure details:    Patient tolerance of procedure:  Tolerated well, no immediate complications   (including critical care time)  Medications Ordered in ED Medications  lidocaine-EPINEPHrine (XYLOCAINE W/EPI) 2 %-1:100000 (with pres) injection 20 mL (not administered)  morphine 4 MG/ML injection 4 mg (4 mg Intravenous Given 04/26/17 0859)  ondansetron (ZOFRAN) injection 4 mg (4 mg Intravenous Given 04/26/17 0859)  iopamidol (ISOVUE-300) 61 % injection 100 mL (75 mLs Intravenous Contrast Given 04/26/17 1110)  oxyCODONE-acetaminophen (PERCOCET/ROXICET) 5-325 MG per tablet 1 tablet (1 tablet Oral Given 04/26/17 1335)  sulfamethoxazole-trimethoprim (BACTRIM DS,SEPTRA DS) 800-160 MG per tablet 1 tablet (1 tablet Oral Given 04/26/17 1335)  cephALEXin (KEFLEX) capsule 500 mg (500 mg Oral Given 04/26/17 1335)     Initial Impression / Assessment and Plan / ED Course  I have reviewed the triage vital signs and the nursing notes.  Pertinent labs &  imaging results that were available during my care of the patient were reviewed by me and considered in my medical decision making (see chart for details).     VS reassuring on arrival. Airway clear, tolerating secretions.  Swelling and redness extends to mandible and below chin, concern for deep space infection and differential does include Ludwig's angina. Obtained CT for better evaluation.  Labs show WBC 17.7.  CT shows 8 x 12 mm dermal  abscess midline anterior to the mandible, near the corresponding area on exam.  Area does not appear to be odontogenic.  I discussed risks and benefits of incision and drainage including risk of scarring.  Patient voiced understanding and was willing to proceed. Very little drainage from I&D. Discussed w/ ENT, Dr. Lazarus Salines, who agrees with plan to start antibiotics and warm compresses and have her see him in clinic tomorrow.  She voiced understanding of this plan as well as return precautions.  Final Clinical Impressions(s) / ED Diagnoses   Final diagnoses:  Abscess of chin    ED Discharge Orders        Ordered    cephALEXin (KEFLEX) 500 MG capsule  2 times daily     04/26/17 1412    sulfamethoxazole-trimethoprim (BACTRIM DS,SEPTRA DS) 800-160 MG tablet  2 times daily     04/26/17 1412    oxyCODONE-acetaminophen (PERCOCET) 5-325 MG tablet  Every 6 hours PRN     04/26/17 1412       Little, Ambrose Finland, MD 04/26/17 1526

## 2017-04-26 NOTE — ED Notes (Signed)
Pt requesting pain medication. Dr. Clarene Duke made aware

## 2017-04-26 NOTE — Discharge Instructions (Signed)
CALL DR. Ray[facility] S[facility] TOMORROW FOR AN APPOINTMENT.

## 2018-12-07 IMAGING — CT CT NECK W/ CM
4 of 6 series · 13 of 33 positions shown, 15 images · IV contrast (iopamidol)
Comparison: None.

CLINICAL DATA: Neck pain.  Suspect infection.  Swelling of chin

EXAM:
CT NECK WITH CONTRAST
TECHNIQUE: Multidetector CT imaging of the neck was performed using the
standard protocol following the bolus administration of intravenous
contrast.
CONTRAST:  75mL [id number] IOPAMIDOL ([id number]) INJECTION 61%

[Series 7: sag neck · sagittal · 0.56mm/px · 5 of 116 slices shown, 6 images]
[im 39/116  bone]
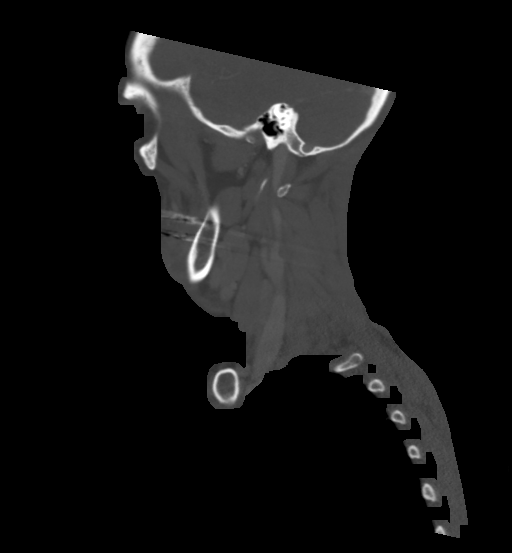
[im 48/116  bone]
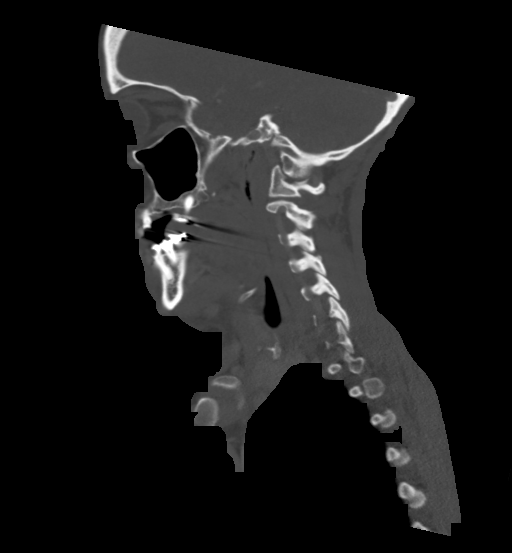
[im 58/116  soft-tissue]
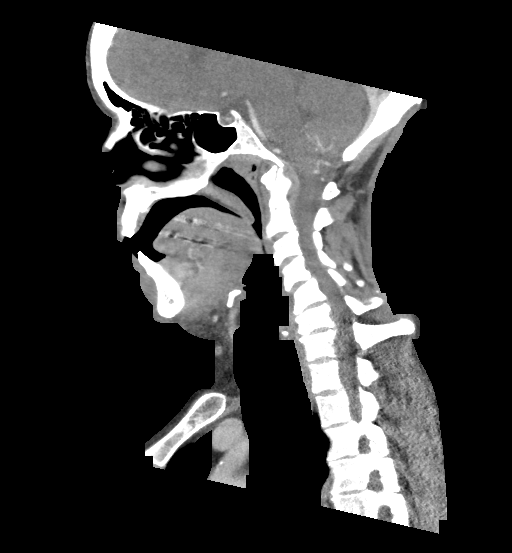
[im 58/116  bone]
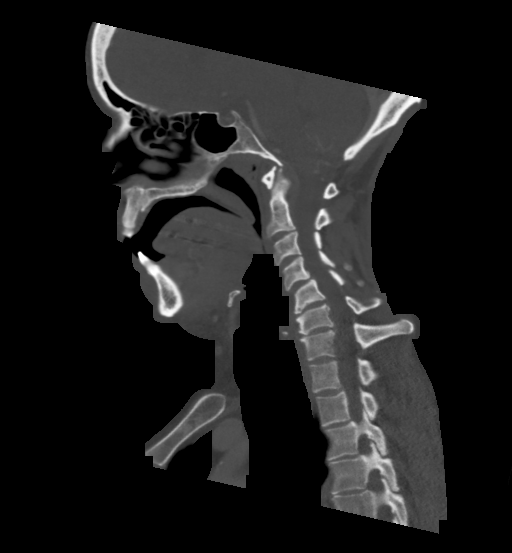
[im 68/116  bone]
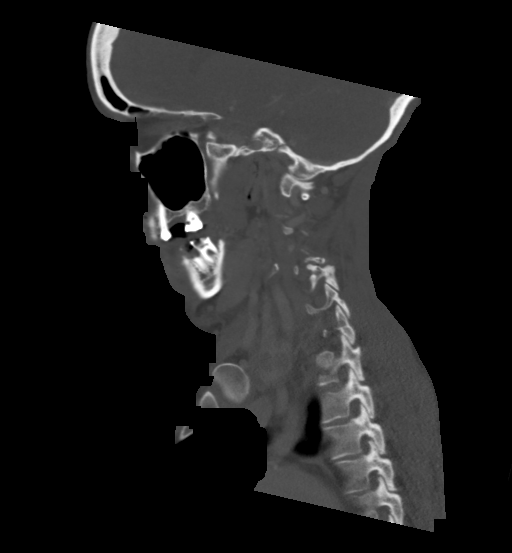
[im 77/116  bone]
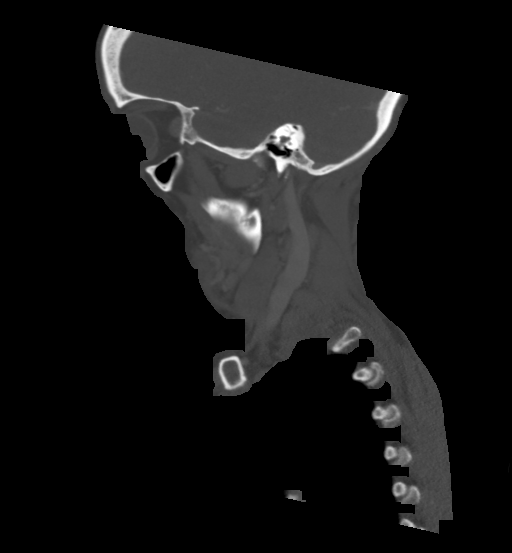

[Series 8: cor neck · coronal · 0.45mm/px · 3 of 103 slices shown]
[im 21/103  bone]
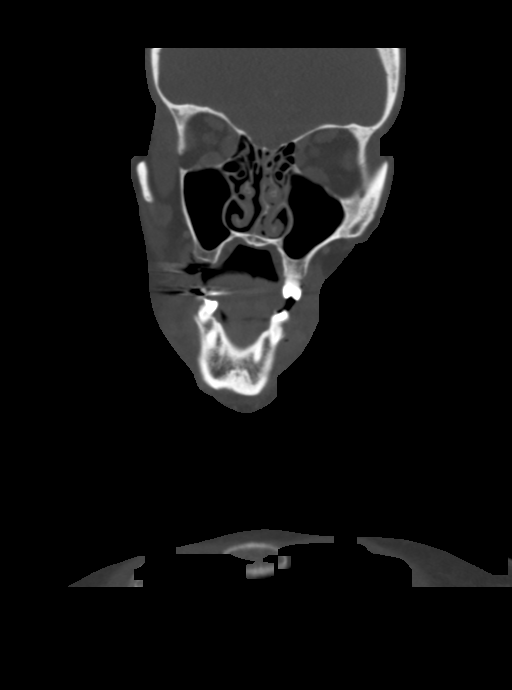
[im 41/103  bone]
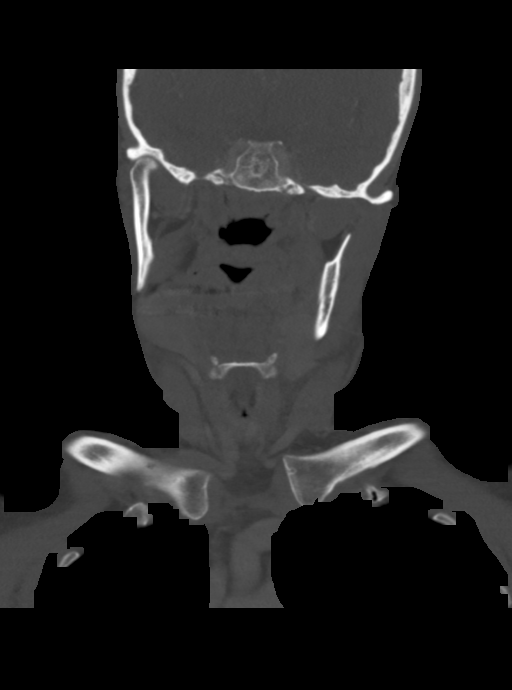
[im 62/103  bone]
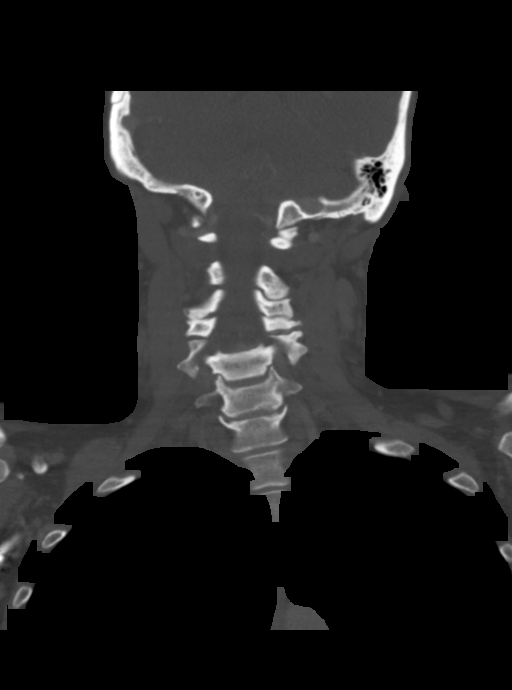

[Series 9: orthogonal ax · axial · 0.45mm/px · z∈[-276,-126]mm · 3 of 155 slices shown, 4 images]
[im 39/155  soft-tissue]
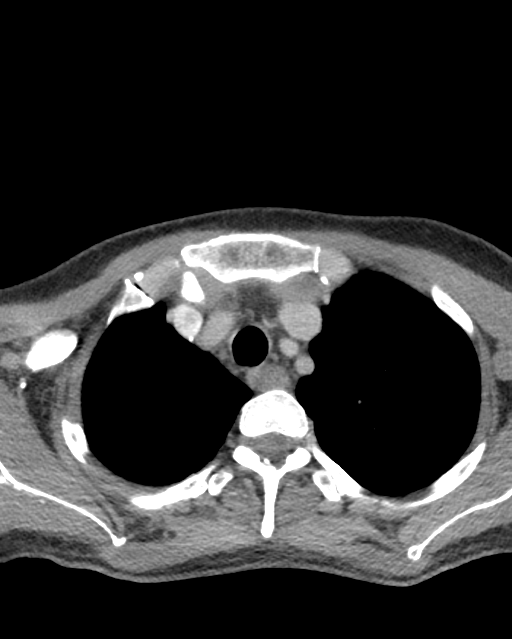
[im 39/155  bone]
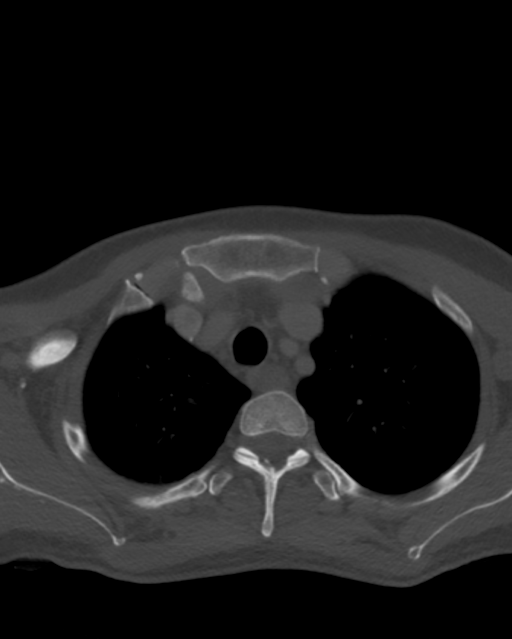
[im 78/155  bone]
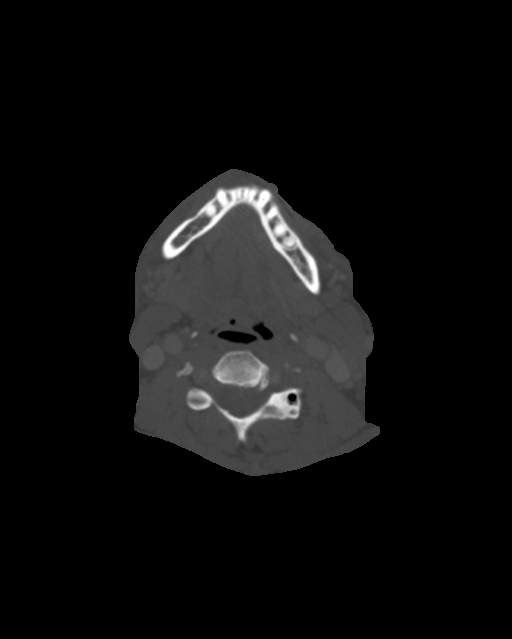
[im 116/155  bone]
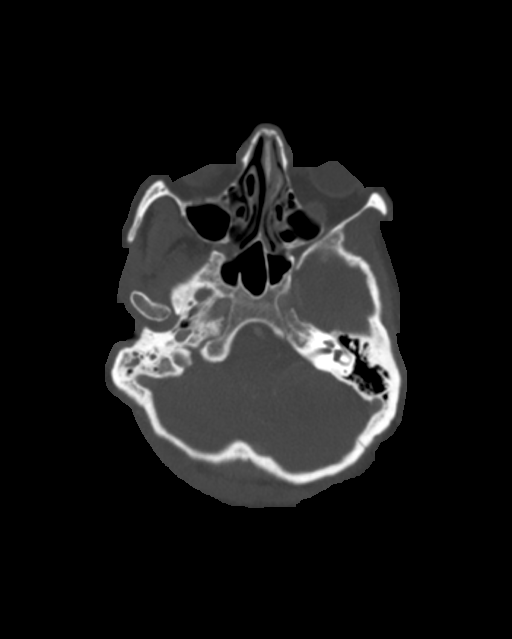

[Series 10: axial neck (person_name) · axial · 0.53mm/px · z∈[-206,-128]mm · 2 of 118 slices shown]
[im 40/118  bone]
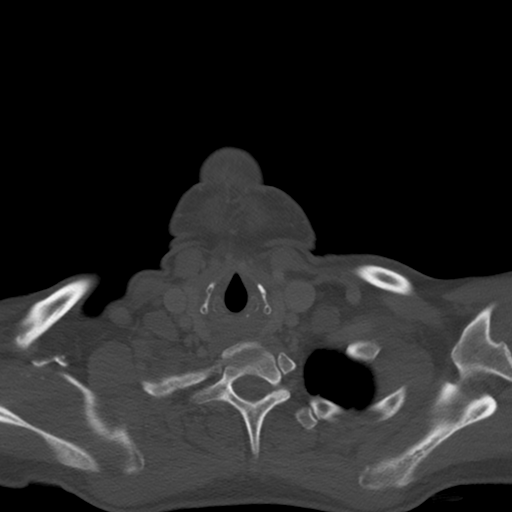
[im 79/118  bone]
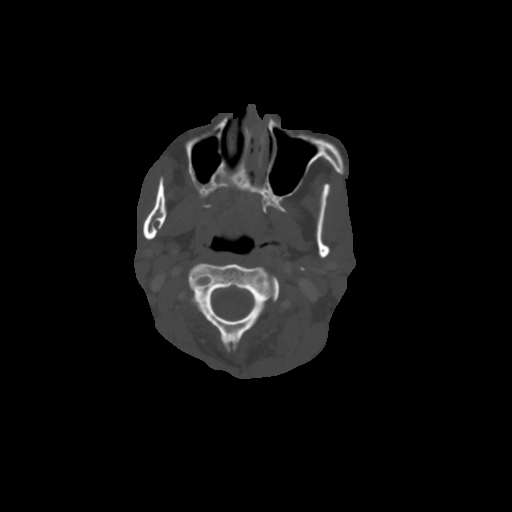

[13 of 33 positions shown; findings below may reference images not displayed]

FINDINGS: Pharynx and larynx: Normal. No mass or swelling.

Salivary glands: No inflammation, mass, or stone.

Thyroid: Multiple subcentimeter left thyroid nodules. Right thyroid
lobe normal.

Lymph nodes: No pathologic adenopathy. Subcentimeter lymph nodes in
the neck bilaterally appear reactive.

Vascular: Negative

Limited intracranial: Negative

Visualized orbits: Negative

Mastoids and visualized paranasal sinuses: Negative

Skeleton: Poor dentition with numerous caries. No periapical dental
infection.

Cervical kyphosis with multilevel degenerative change.

Upper chest: Emphysema without mass or infiltrate

Other: Soft tissue swelling anterior to the mandible in the midline.
Subcutaneous rim enhancing fluid collection measuring 8 x 12 mm
compatible with dermal abscess with surrounding cellulitis. This
does not appear to be of dental origin.
IMPRESSION: 8 x 12 mm dermal abscess anterior to the mandible in the midline.

Poor dentition with numerous caries

Reactive lymph nodes in the neck

Multiple subcentimeter left thyroid nodules.

## 2020-08-08 DEATH — deceased
